# Patient Record
Sex: Female | Born: 2000 | Hispanic: Yes | Marital: Single
Health system: Southern US, Community
[De-identification: ages and names within clinical notes are randomized; demographics above are authoritative.]

## PROBLEM LIST (undated history)

## (undated) DIAGNOSIS — R569 Unspecified convulsions: Secondary | ICD-10-CM

## (undated) DIAGNOSIS — J45909 Unspecified asthma, uncomplicated: Secondary | ICD-10-CM

---
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
## 2024-09-29 ENCOUNTER — Ambulatory Visit
Admission: EM | Admit: 2024-09-29 | Discharge: 2024-09-29 | Disposition: A | Discharge status: Home / Self Care | Attending: Internal Medicine | Admitting: Internal Medicine

## 2024-09-29 ENCOUNTER — Ambulatory Visit

## 2024-09-29 ENCOUNTER — Other Ambulatory Visit: Payer: Self-pay

## 2024-09-29 DIAGNOSIS — M25531 Pain in right wrist: Secondary | ICD-10-CM

## 2024-09-29 DIAGNOSIS — S60211A Contusion of right wrist, initial encounter: Secondary | ICD-10-CM

## 2024-09-29 MED ORDER — ACETAMINOPHEN 325 MG PO TABS
975.0000 mg | ORAL_TABLET | Freq: Once | ORAL | Status: AC
  Administered 2024-09-29: 975 mg via ORAL

## 2024-09-29 MED ORDER — ACETAMINOPHEN 500 MG PO TABS: 1000.0000 mg | ORAL_TABLET | Freq: Once | ORAL | Status: DC
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
## 2024-09-29 NOTE — ED Notes (Signed)
 Velcro wrist splint applied. Patient tolerated well. Teaching provided.

## 2024-09-29 NOTE — Discharge Instructions (Addendum)
 Your x-rays showed no broken bones or dislocations. They were sent to a radiologist for further evaluation and we will call you if the radiologist sees anything different.   Recommend rest, ice, compression, and elevation.  You were given 975 mg of Tylenol  today in office for pain.  You were provided with a brace/splint today in office. I recommend you wear it for the next week. If no improvement within that time, you should follow up with orthopedics.   It is very important for you to pay attention to any new symptoms or worsening of your current condition. Please go directly to the Emergency Department immediately should you begin to feel worse in any way or have any of the following symptoms: fevers, increased pain, increased swelling, increased redness, or color changes.

## 2024-10-28 ENCOUNTER — Telehealth: Payer: Self-pay

## 2024-10-28 NOTE — Telephone Encounter (Signed)
 Patient was called to notify her that we needed consent to sign on behalf of her for her Todd Mini form. Patient was left a message instructing her to call back.
